# Patient Record
Sex: Male | Born: 2000 | Race: Black or African American | Hispanic: No | Marital: Single
Health system: Southern US, Community
[De-identification: ages and names within clinical notes are randomized; demographics above are authoritative.]

---
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
## 2022-03-17 ENCOUNTER — Ambulatory Visit: Payer: Medicaid Other | Admitting: Internal Medicine

## 2022-06-09 ENCOUNTER — Ambulatory Visit: Payer: Medicaid Other | Admitting: Internal Medicine
